# Patient Record
Sex: Male | Born: 2000 | Race: Black or African American | Hispanic: No | Marital: Single | Smoking: Never smoker
Health system: Southern US, Community
[De-identification: ages and names within clinical notes are randomized; demographics above are authoritative.]

## PROBLEM LIST (undated history)

## (undated) DIAGNOSIS — L309 Dermatitis, unspecified: Secondary | ICD-10-CM

## (undated) DIAGNOSIS — J45909 Unspecified asthma, uncomplicated: Secondary | ICD-10-CM

## (undated) DIAGNOSIS — J302 Other seasonal allergic rhinitis: Secondary | ICD-10-CM

## (undated) HISTORY — PX: CIRCUMCISION: SUR203

---
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
## 2022-01-04 LAB — CYTOLOGY, (ORAL, ANAL, URETHRAL) ANCILLARY ONLY
Chlamydia: NEGATIVE
Comment: NEGATIVE
Comment: NEGATIVE
Comment: NORMAL
Neisseria Gonorrhea: NEGATIVE
Trichomonas: NEGATIVE

## 2022-04-16 ENCOUNTER — Emergency Department (HOSPITAL_COMMUNITY): Payer: Medicaid Other

## 2022-04-16 ENCOUNTER — Emergency Department (HOSPITAL_COMMUNITY)
Admission: EM | Admit: 2022-04-16 | Discharge: 2022-04-16 | Disposition: A | Discharge status: Home / Self Care | Payer: Medicaid Other | Attending: Emergency Medicine | Admitting: Emergency Medicine

## 2022-04-16 ENCOUNTER — Encounter (HOSPITAL_COMMUNITY): Payer: Self-pay | Admitting: *Deleted

## 2022-04-16 ENCOUNTER — Other Ambulatory Visit: Payer: Self-pay

## 2022-04-16 DIAGNOSIS — S8992XA Unspecified injury of left lower leg, initial encounter: Secondary | ICD-10-CM | POA: Diagnosis present

## 2022-04-16 DIAGNOSIS — W3400XA Accidental discharge from unspecified firearms or gun, initial encounter: Secondary | ICD-10-CM | POA: Diagnosis not present

## 2022-04-16 DIAGNOSIS — S81832A Puncture wound without foreign body, left lower leg, initial encounter: Secondary | ICD-10-CM | POA: Insufficient documentation

## 2022-04-16 DIAGNOSIS — S50812A Abrasion of left forearm, initial encounter: Secondary | ICD-10-CM | POA: Diagnosis not present

## 2022-04-16 DIAGNOSIS — Y92007 Garden or yard of unspecified non-institutional (private) residence as the place of occurrence of the external cause: Secondary | ICD-10-CM | POA: Insufficient documentation

## 2022-04-16 DIAGNOSIS — S80811A Abrasion, right lower leg, initial encounter: Secondary | ICD-10-CM | POA: Insufficient documentation

## 2022-04-16 LAB — COMPREHENSIVE METABOLIC PANEL
ALT: 9 U/L (ref 0–44)
AST: 19 U/L (ref 15–41)
Albumin: 4 g/dL (ref 3.5–5.0)
Alkaline Phosphatase: 68 U/L (ref 38–126)
Anion gap: 9 (ref 5–15)
BUN: 8 mg/dL (ref 6–20)
CO2: 25 mmol/L (ref 22–32)
Calcium: 9.4 mg/dL (ref 8.9–10.3)
Chloride: 104 mmol/L (ref 98–111)
Creatinine, Ser: 1.21 mg/dL (ref 0.61–1.24)
GFR, Estimated: >60 mL/min (ref >60)
Glucose, Bld: 117 mg/dL — ABNORMAL HIGH (ref 70–99)
Potassium: 3.1 mmol/L — ABNORMAL LOW (ref 3.5–5.1)
Sodium: 138 mmol/L (ref 135–145)
Total Bilirubin: 0.7 mg/dL (ref 0.3–1.2)
Total Protein: 6.7 g/dL (ref 6.5–8.1)

## 2022-04-16 LAB — I-STAT CHEM 8, ED
BUN: 8 mg/dL (ref 6–20)
Calcium, Ion: 1.09 mmol/L — ABNORMAL LOW (ref 1.15–1.40)
Chloride: 100 mmol/L (ref 98–111)
Creatinine, Ser: 1.1 mg/dL (ref 0.61–1.24)
Glucose, Bld: 114 mg/dL — ABNORMAL HIGH (ref 70–99)
HCT: 42 % (ref 39.0–52.0)
Hemoglobin: 14.3 g/dL (ref 13.0–17.0)
Potassium: 3.1 mmol/L — ABNORMAL LOW (ref 3.5–5.1)
Sodium: 141 mmol/L (ref 135–145)
TCO2: 23 mmol/L (ref 22–32)

## 2022-04-16 LAB — CBC
HCT: 40.7 % (ref 39.0–52.0)
Hemoglobin: 13.5 g/dL (ref 13.0–17.0)
MCH: 28.1 pg (ref 26.0–34.0)
MCHC: 33.2 g/dL (ref 30.0–36.0)
MCV: 84.8 fL (ref 80.0–100.0)
Platelets: 246 10*3/uL (ref 150–400)
RBC: 4.8 MIL/uL (ref 4.22–5.81)
RDW: 13.7 % (ref 11.5–15.5)
WBC: 9.8 10*3/uL (ref 4.0–10.5)
nRBC: 0 % (ref 0.0–0.2)

## 2022-04-16 LAB — ETHANOL: Alcohol, Ethyl (B): <10 mg/dL (ref <10)

## 2022-04-16 MED ORDER — MORPHINE SULFATE (PF) 4 MG/ML IV SOLN
4.0000 mg | Freq: Once | INTRAVENOUS | Status: AC
  Administered 2022-04-16: 4 mg via INTRAVENOUS
  Filled 2022-04-16: qty 1

## 2022-04-16 MED ORDER — CEPHALEXIN 500 MG PO CAPS: 500.0000 mg | ORAL_CAPSULE | Freq: Three times a day (TID) | ORAL | 0 refills | Status: DC

## 2022-04-16 MED ORDER — OXYCODONE-ACETAMINOPHEN 5-325 MG PO TABS: 1.0000 | ORAL_TABLET | Freq: Four times a day (QID) | ORAL | 0 refills | Status: DC | PRN

## 2022-04-16 MED ORDER — IOHEXOL 350 MG/ML SOLN
100.0000 mL | Freq: Once | INTRAVENOUS | Status: AC | PRN
  Administered 2022-04-16: 100 mL via INTRAVENOUS

## 2022-04-16 MED ORDER — LACTATED RINGERS IV BOLUS
1000.0000 mL | Freq: Once | INTRAVENOUS | Status: AC
  Administered 2022-04-16: 1000 mL via INTRAVENOUS

## 2022-04-16 MED ORDER — CEFAZOLIN SODIUM-DEXTROSE 2-4 GM/100ML-% IV SOLN
2.0000 g | Freq: Once | INTRAVENOUS | Status: AC
  Administered 2022-04-16: 2 g via INTRAVENOUS
  Filled 2022-04-16: qty 100

## 2022-04-16 MED ORDER — ONDANSETRON HCL 4 MG/2ML IJ SOLN
4.0000 mg | Freq: Once | INTRAMUSCULAR | Status: AC
  Administered 2022-04-16: 4 mg via INTRAVENOUS
  Filled 2022-04-16: qty 2
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
## 2022-04-16 NOTE — ED Notes (Signed)
Lab at BS 

## 2022-04-16 NOTE — ED Notes (Signed)
TRN at [facility]. Pt going to CT. No changes, alert, NAD, calm, interactive, resps e/u, speaking in clear complete sentences.
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
## 2022-04-16 NOTE — ED Notes (Signed)
Registration finished at [facility]. [employer] into room speaking with pt.

## 2022-04-16 NOTE — ED Notes (Signed)
Xray at BS 
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
## 2023-08-06 ENCOUNTER — Other Ambulatory Visit: Payer: Self-pay

## 2023-08-06 ENCOUNTER — Encounter (HOSPITAL_COMMUNITY): Payer: Self-pay | Admitting: Emergency Medicine

## 2023-08-06 ENCOUNTER — Emergency Department (HOSPITAL_COMMUNITY): Payer: Medicaid Other

## 2023-08-06 ENCOUNTER — Emergency Department (HOSPITAL_COMMUNITY)
Admission: EM | Admit: 2023-08-06 | Discharge: 2023-08-07 | Disposition: A | Discharge status: Home / Self Care | Payer: Medicaid Other | Attending: Emergency Medicine | Admitting: Emergency Medicine

## 2023-08-06 DIAGNOSIS — Z4800 Encounter for change or removal of nonsurgical wound dressing: Secondary | ICD-10-CM | POA: Diagnosis not present

## 2023-08-06 DIAGNOSIS — W3400XA Accidental discharge from unspecified firearms or gun, initial encounter: Secondary | ICD-10-CM | POA: Diagnosis not present

## 2023-08-06 DIAGNOSIS — S61431A Puncture wound without foreign body of right hand, initial encounter: Secondary | ICD-10-CM | POA: Diagnosis present

## 2023-08-06 LAB — I-STAT CHEM 8, ED
BUN: 13 mg/dL (ref 6–20)
Calcium, Ion: 1.09 mmol/L — ABNORMAL LOW (ref 1.15–1.40)
Chloride: 102 mmol/L (ref 98–111)
Creatinine, Ser: 1.2 mg/dL (ref 0.61–1.24)
Glucose, Bld: 124 mg/dL — ABNORMAL HIGH (ref 70–99)
HCT: 40 % (ref 39.0–52.0)
Hemoglobin: 13.6 g/dL (ref 13.0–17.0)
Potassium: 3 mmol/L — ABNORMAL LOW (ref 3.5–5.1)
Sodium: 142 mmol/L (ref 135–145)
TCO2: 24 mmol/L (ref 22–32)

## 2023-08-06 LAB — I-STAT CG4 LACTIC ACID, ED: Lactic Acid, Venous: 5.3 mmol/L (ref 0.5–1.9)

## 2023-08-06 MED ORDER — HYDROMORPHONE HCL 1 MG/ML IJ SOLN
0.5000 mg | Freq: Once | INTRAMUSCULAR | Status: AC
  Administered 2023-08-06: 0.5 mg via INTRAVENOUS
  Filled 2023-08-06: qty 1

## 2023-08-06 MED ORDER — OXYCODONE-ACETAMINOPHEN 5-325 MG PO TABS: 1.0000 | ORAL_TABLET | Freq: Four times a day (QID) | ORAL | 0 refills | Status: DC | PRN

## 2023-08-06 MED ORDER — CEFAZOLIN SODIUM-DEXTROSE 2-4 GM/100ML-% IV SOLN
2.0000 g | Freq: Once | INTRAVENOUS | Status: AC
  Administered 2023-08-06: 2 g via INTRAVENOUS
  Filled 2023-08-06: qty 100

## 2023-08-06 MED ORDER — FENTANYL CITRATE PF 50 MCG/ML IJ SOSY
50.0000 ug | PREFILLED_SYRINGE | INTRAMUSCULAR | Status: AC | PRN
  Administered 2023-08-06 (×2): 50 ug via INTRAVENOUS
  Filled 2023-08-06 (×2): qty 1

## 2023-08-06 MED ORDER — LIDOCAINE-EPINEPHRINE (PF) 2 %-1:200000 IJ SOLN
10.0000 mL | Freq: Once | INTRAMUSCULAR | Status: AC
  Administered 2023-08-06: 10 mL
  Filled 2023-08-06: qty 20

## 2023-08-06 MED ORDER — FENTANYL CITRATE PF 50 MCG/ML IJ SOSY
50.0000 ug | PREFILLED_SYRINGE | Freq: Once | INTRAMUSCULAR | Status: AC
  Administered 2023-08-06: 50 ug via INTRAVENOUS
  Filled 2023-08-06: qty 1

## 2023-08-06 MED ORDER — CEPHALEXIN 500 MG PO CAPS: 500.0000 mg | ORAL_CAPSULE | Freq: Four times a day (QID) | ORAL | 0 refills | Status: AC
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
## 2023-08-06 NOTE — ED Notes (Signed)
Pt arrives to room 9 at this time. Taking over care of this pt at this time.

## 2023-08-06 NOTE — Discharge Instructions (Addendum)
Call the hand surgeon to follow-up later this week.  Take the antibiotics.  Take the pain medicines as needed.  Watch for increasing pain or signs of infection.

## 2023-08-06 NOTE — ED Notes (Signed)
Dressing placed on wound using non adherent dressing, 4x4 gauze, gauze roll and coban prior to splint placement.

## 2023-08-06 NOTE — ED Provider Notes (Signed)
..  Laceration Repair  Date/Time: 08/06/2023 11:05 PM  Performed by: Dolphus Jenny, PA-C Authorized by: Dolphus Jenny, PA-C   Consent:    Consent obtained:  Verbal   Consent given by:  Patient   Risks discussed:  Infection, pain, poor cosmetic result, tendon damage, nerve damage, need for additional repair and retained foreign body   Alternatives discussed:  No treatment Universal protocol:    Imaging studies available: yes     Patient identity confirmed:  Verbally with patient Anesthesia:    Anesthesia method:  Local infiltration   Local anesthetic:  Lidocaine 2% WITH epi Laceration details:    Location:  Hand   Hand location:  R hand, dorsum   Length (cm):  4   Depth (mm):  6 Pre-procedure details:    Preparation:  Patient was prepped and draped in usual sterile fashion and imaging obtained to evaluate for foreign bodies Exploration:    Imaging obtained: x-ray   Treatment:    Area cleansed with:  Saline   Amount of cleaning:  Extensive   Irrigation solution:  Sterile water   Irrigation method:  Pressure wash Skin repair:    Repair method:  Sutures   Suture size:  4-0   Suture material:  Prolene   Number of sutures:  3 Approximation:    Approximation:  Loose Repair type:    Repair type:  Simple Post-procedure details:    Procedure completion:  Karma Lew, PA-C 08/06/23 2306    Benjiman Core, MD 08/07/23 0001

## 2023-08-06 NOTE — ED Notes (Signed)
Wound irrigated prior to wound closure.
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
## 2023-08-06 NOTE — ED Notes (Signed)
Portable xray called to triage for imaging

## 2023-08-07 ENCOUNTER — Emergency Department (HOSPITAL_COMMUNITY)
Admission: EM | Admit: 2023-08-07 | Discharge: 2023-08-07 | Disposition: A | Discharge status: Home / Self Care | Payer: Medicaid Other | Source: Home / Self Care | Attending: Emergency Medicine | Admitting: Emergency Medicine

## 2023-08-07 ENCOUNTER — Telehealth: Payer: Self-pay | Admitting: Orthopedic Surgery

## 2023-08-07 DIAGNOSIS — S61431D Puncture wound without foreign body of right hand, subsequent encounter: Secondary | ICD-10-CM | POA: Insufficient documentation

## 2023-08-07 DIAGNOSIS — Z5189 Encounter for other specified aftercare: Secondary | ICD-10-CM

## 2023-08-07 DIAGNOSIS — W3400XD Accidental discharge from unspecified firearms or gun, subsequent encounter: Secondary | ICD-10-CM | POA: Insufficient documentation

## 2023-08-07 DIAGNOSIS — Z4800 Encounter for change or removal of nonsurgical wound dressing: Secondary | ICD-10-CM | POA: Insufficient documentation
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
## 2023-08-07 NOTE — Discharge Instructions (Addendum)
Follow-up as noted in previous visit from yesterday within the week.  Keep area dry.  Can take Tylenol or ibuprofen for pain every 4-6 hours.  Return to office if experiencing bleeding through bandage, fever, dizziness, numbness in fingers/inability to move fingers/fingers changing color.

## 2023-08-07 NOTE — Progress Notes (Signed)
Orthopedic Tech Progress Note Patient Details:  Nicholas Koch [birth year] [id number]  Ortho Devices Type of Ortho Device: Volar splint Ortho Device/Splint Location: rue volar and dorsal Ortho Device/Splint Interventions: Ordered, Application, Adjustment  I applied a long volar and dorsal from pip to lower forearm at drs request. Post Interventions Patient Tolerated: Well Instructions Provided: Care of device, Adjustment of device  Trinna Post 08/07/2023, 6:56 AM
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
## 2023-08-07 NOTE — ED Notes (Signed)
R hand wound dressing applied using Xeroform, combat gauze, and wrapped prior to ortho tech providing splint.

## 2023-08-07 NOTE — Progress Notes (Signed)
Orthopedic Tech Progress Note Patient Details:  Nicholas Koch 2001/02/15 [id number]  Ortho Devices Type of Ortho Device: Volar splint Ortho Device/Splint Location: RUE Ortho Device/Splint Interventions: Ordered, Application, Adjustment   Post Interventions Patient Tolerated: Well Instructions Provided: Care of device  [name] 08/07/2023, 10:07 PM

## 2023-08-09 ENCOUNTER — Ambulatory Visit: Payer: Medicaid Other | Admitting: Orthopedic Surgery

## 2023-08-10 ENCOUNTER — Ambulatory Visit (INDEPENDENT_AMBULATORY_CARE_PROVIDER_SITE_OTHER): Payer: Medicaid Other | Admitting: Orthopedic Surgery

## 2023-08-10 ENCOUNTER — Other Ambulatory Visit (INDEPENDENT_AMBULATORY_CARE_PROVIDER_SITE_OTHER): Payer: Self-pay

## 2023-08-10 ENCOUNTER — Other Ambulatory Visit (INDEPENDENT_AMBULATORY_CARE_PROVIDER_SITE_OTHER): Payer: Medicaid Other

## 2023-08-10 ENCOUNTER — Other Ambulatory Visit: Payer: Self-pay | Admitting: Orthopedic Surgery

## 2023-08-10 DIAGNOSIS — W3400XA Accidental discharge from unspecified firearms or gun, initial encounter: Secondary | ICD-10-CM

## 2023-08-10 DIAGNOSIS — M25531 Pain in right wrist: Secondary | ICD-10-CM

## 2023-08-10 MED ORDER — HYDROCODONE-ACETAMINOPHEN 5-325 MG PO TABS: 1.0000 | ORAL_TABLET | Freq: Four times a day (QID) | ORAL | 0 refills | Status: DC | PRN
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
## 2023-08-13 ENCOUNTER — Other Ambulatory Visit: Payer: Self-pay | Admitting: Orthopedic Surgery

## 2023-08-13 MED ORDER — HYDROCODONE-ACETAMINOPHEN 5-325 MG PO TABS: 1.0000 | ORAL_TABLET | Freq: Four times a day (QID) | ORAL | 0 refills | Status: AC | PRN

## 2023-08-14 ENCOUNTER — Other Ambulatory Visit: Payer: Self-pay | Admitting: Orthopedic Surgery

## 2023-08-14 DIAGNOSIS — M25531 Pain in right wrist: Secondary | ICD-10-CM

## 2023-08-14 DIAGNOSIS — S61441A Puncture wound with foreign body of right hand, initial encounter: Secondary | ICD-10-CM | POA: Diagnosis not present

## 2023-08-14 DIAGNOSIS — W3400XA Accidental discharge from unspecified firearms or gun, initial encounter: Secondary | ICD-10-CM

## 2023-08-14 DIAGNOSIS — S66201A Unspecified injury of extensor muscle, fascia and tendon of right thumb at wrist and hand level, initial encounter: Secondary | ICD-10-CM | POA: Diagnosis not present

## 2023-08-16 ENCOUNTER — Telehealth: Payer: Self-pay | Admitting: Orthopedic Surgery

## 2023-08-16 NOTE — Telephone Encounter (Signed)
Patient called at 11:57 am and left voicemail wanting to know if he can drive?    Please call him back at [phone]

## 2023-08-16 NOTE — Telephone Encounter (Signed)
Spoke with patient. He is not on any narcotics, so I informed him that he may drive then.

## 2023-08-17 ENCOUNTER — Telehealth: Payer: Self-pay | Admitting: Orthopedic Surgery

## 2023-08-17 NOTE — Telephone Encounter (Signed)
[place] forms received. Please advise how long patient will be oow. Thank you!

## 2023-08-20 NOTE — Telephone Encounter (Signed)
Form completed and faxed. 

## 2023-08-22 ENCOUNTER — Ambulatory Visit (INDEPENDENT_AMBULATORY_CARE_PROVIDER_SITE_OTHER): Payer: Medicaid Other | Admitting: Orthopedic Surgery

## 2023-08-22 ENCOUNTER — Telehealth: Payer: Self-pay | Admitting: Orthopedic Surgery

## 2023-08-22 DIAGNOSIS — S61441D Puncture wound with foreign body of right hand, subsequent encounter: Secondary | ICD-10-CM

## 2023-08-22 DIAGNOSIS — W3400XA Accidental discharge from unspecified firearms or gun, initial encounter: Secondary | ICD-10-CM
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
## 2023-08-27 NOTE — Telephone Encounter (Signed)
Spoke with mom Patient is scheduled for Friday 6th

## 2023-08-30 ENCOUNTER — Encounter: Payer: Medicaid Other | Admitting: Occupational Therapy

## 2023-08-31 ENCOUNTER — Ambulatory Visit (INDEPENDENT_AMBULATORY_CARE_PROVIDER_SITE_OTHER): Payer: Medicaid Other | Admitting: Orthopedic Surgery

## 2023-08-31 DIAGNOSIS — S61441A Puncture wound with foreign body of right hand, initial encounter: Secondary | ICD-10-CM

## 2023-08-31 DIAGNOSIS — W3400XA Accidental discharge from unspecified firearms or gun, initial encounter: Secondary | ICD-10-CM
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
## 2023-09-11 ENCOUNTER — Ambulatory Visit: Payer: Medicaid Other | Admitting: Occupational Therapy

## 2023-09-12 ENCOUNTER — Ambulatory Visit: Payer: Medicaid Other | Attending: Orthopedic Surgery | Admitting: Occupational Therapy

## 2023-09-12 ENCOUNTER — Encounter: Payer: Self-pay | Admitting: Occupational Therapy

## 2023-09-12 ENCOUNTER — Ambulatory Visit (INDEPENDENT_AMBULATORY_CARE_PROVIDER_SITE_OTHER): Payer: Medicaid Other | Admitting: Orthopedic Surgery

## 2023-09-12 DIAGNOSIS — M6281 Muscle weakness (generalized): Secondary | ICD-10-CM | POA: Insufficient documentation

## 2023-09-12 DIAGNOSIS — W3400XA Accidental discharge from unspecified firearms or gun, initial encounter: Secondary | ICD-10-CM

## 2023-09-12 DIAGNOSIS — R278 Other lack of coordination: Secondary | ICD-10-CM | POA: Diagnosis present

## 2023-09-12 DIAGNOSIS — Z9889 Other specified postprocedural states: Secondary | ICD-10-CM | POA: Insufficient documentation

## 2023-09-12 DIAGNOSIS — M25531 Pain in right wrist: Secondary | ICD-10-CM | POA: Diagnosis not present

## 2023-09-12 DIAGNOSIS — R29898 Other symptoms and signs involving the musculoskeletal system: Secondary | ICD-10-CM | POA: Insufficient documentation

## 2023-09-12 DIAGNOSIS — S61441A Puncture wound with foreign body of right hand, initial encounter: Secondary | ICD-10-CM
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
## 2023-09-14 ENCOUNTER — Telehealth: Payer: Self-pay | Admitting: Orthopedic Surgery

## 2023-09-14 NOTE — Telephone Encounter (Signed)
[place] forms received. To [employer]. 

## 2023-09-17 ENCOUNTER — Ambulatory Visit: Payer: Medicaid Other | Admitting: Occupational Therapy

## 2023-09-20 ENCOUNTER — Telehealth: Payer: Self-pay | Admitting: Occupational Therapy

## 2023-09-20 ENCOUNTER — Ambulatory Visit: Payer: Medicaid Other | Admitting: Occupational Therapy

## 2023-09-20 NOTE — Telephone Encounter (Signed)
Pt no-showed for today's OT appointment, which is second no-show. OT called pt's listed mobile phone number [phone]).   OT educated pt on clinic's no-show/late policy. Pt verbalized understanding. OT provided pt with date/time of next OT appointment. Pt verbalized understanding. Pt reported some confusion d/t thinking next OT appointment was 10/08/23. OT verified that 10/08/23 was the date of pt's f/u MD appointment. OT educated pt on OT POC 2x per week and pt verbalized understanding of all.

## 2023-09-25 ENCOUNTER — Ambulatory Visit: Payer: Medicaid Other | Admitting: Occupational Therapy

## 2023-09-25 DIAGNOSIS — R278 Other lack of coordination: Secondary | ICD-10-CM | POA: Diagnosis not present

## 2023-09-25 DIAGNOSIS — R29898 Other symptoms and signs involving the musculoskeletal system: Secondary | ICD-10-CM

## 2023-09-25 DIAGNOSIS — M6281 Muscle weakness (generalized): Secondary | ICD-10-CM
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
## 2023-09-27 ENCOUNTER — Ambulatory Visit: Payer: Medicaid Other | Attending: Orthopedic Surgery | Admitting: Occupational Therapy

## 2023-09-27 DIAGNOSIS — M6281 Muscle weakness (generalized): Secondary | ICD-10-CM | POA: Insufficient documentation

## 2023-09-27 DIAGNOSIS — R29898 Other symptoms and signs involving the musculoskeletal system: Secondary | ICD-10-CM | POA: Diagnosis present

## 2023-09-27 DIAGNOSIS — R278 Other lack of coordination: Secondary | ICD-10-CM | POA: Insufficient documentation

## 2023-09-27 NOTE — Patient Instructions (Signed)
Swelling Reduction  Place affected area above the level of your heart. You can use pillows for support.  Move the affected area. Think about tightening the muscles in this area and then relaxing them.  "Pet" the area from the outside of your body toward the center of your body. If it is your hand, from the tips of your fingers to the shoulder or chest.    
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
## 2023-10-02 ENCOUNTER — Ambulatory Visit: Payer: Medicaid Other | Admitting: Occupational Therapy
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
## 2023-10-04 ENCOUNTER — Ambulatory Visit: Payer: Medicaid Other | Admitting: Occupational Therapy

## 2023-10-05 ENCOUNTER — Ambulatory Visit: Payer: Medicaid Other | Admitting: Occupational Therapy

## 2023-10-05 DIAGNOSIS — R278 Other lack of coordination: Secondary | ICD-10-CM

## 2023-10-05 DIAGNOSIS — R29898 Other symptoms and signs involving the musculoskeletal system: Secondary | ICD-10-CM

## 2023-10-05 DIAGNOSIS — M6281 Muscle weakness (generalized): Secondary | ICD-10-CM

## 2023-10-05 NOTE — Patient Instructions (Addendum)
 [email or web address] Access Code: [email or web address]
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
## 2023-10-08 ENCOUNTER — Encounter: Payer: Medicaid Other | Admitting: Orthopedic Surgery

## 2023-10-09 ENCOUNTER — Ambulatory Visit: Payer: Medicaid Other | Admitting: Occupational Therapy

## 2023-10-09 DIAGNOSIS — M6281 Muscle weakness (generalized): Secondary | ICD-10-CM

## 2023-10-09 DIAGNOSIS — R278 Other lack of coordination: Secondary | ICD-10-CM | POA: Diagnosis not present

## 2023-10-09 DIAGNOSIS — R29898 Other symptoms and signs involving the musculoskeletal system: Secondary | ICD-10-CM
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
## 2023-10-09 NOTE — Patient Instructions (Addendum)
 Scar massage: Circles, perpendicular, parallel, "moon walk"   At least 5-7x per day for 3- to 5-minute sessions Frequency and consistency are more important than a single long session.  Exercises Active-assisted "place and hold" (same as on 10/05/23 handout) - use L hand to bend fingers, try to hold position actively with R hand. Finger opposition / Finger taps - Active right hand Step (1) "Bear claw" (bend fingers down). Step (2) Bring thumb towards fingers (alternate each finger) Step (3) - Try to incorporate bending "stiff knuckles / "big knuckles" (MCP joints)  Splint Wear splint at night and when driving Remove 3x per day for 1 to 1.5 hour sessions and when completing exercises

## 2023-10-10 ENCOUNTER — Ambulatory Visit (INDEPENDENT_AMBULATORY_CARE_PROVIDER_SITE_OTHER): Payer: Medicaid Other | Admitting: Orthopedic Surgery

## 2023-10-10 DIAGNOSIS — W3400XA Accidental discharge from unspecified firearms or gun, initial encounter: Secondary | ICD-10-CM

## 2023-10-10 DIAGNOSIS — S61441A Puncture wound with foreign body of right hand, initial encounter: Secondary | ICD-10-CM

## 2023-10-10 NOTE — Progress Notes (Signed)
   Nicholas Koch - 23 y.o. male MRN [id number]  Date of birth: 07-08-2001  Office Visit Note: Visit Date: 10/10/2023 PCP: Brendan Call, MD Referred by: Brendan Call, MD  Subjective:  HPI: Nicholas Koch is a 23 y.o. male who presents today for follow up 8 weeks status post right forearm foreign body excision, right hand extensor tendon repair.  He is doing well postoperatively, he is doing occupational therapy twice a week and is improving from a range of motion standpoint.  Pertinent ROS were reviewed with the patient and found to be negative unless otherwise specified above in HPI.   Assessment & Plan: Visit Diagnoses: No diagnosis found.  Plan: He continues to do well postoperatively.  I have encouraged that he continue with occupational therapy as instructed with emphasis on range of motion and progression to strengthening as tolerated both the hand and wrist.  He is interested in potentially returning to work.  Will keep him on light duty for the time being, 5 pound weight restriction of the right hand, utilized the brace as needed.  I will plan on seeing him back in approximate weeks to track his progress.  Follow-up: No follow-ups on file.   Meds & Orders: No orders of the defined types were placed in this encounter.  No orders of the defined types were placed in this encounter.    Procedures: No procedures performed       Objective:   Vital Signs: There were no vitals taken for this visit.  Ortho Exam Right hand and forearm - Well-healed incisional sites over the dorsal aspect of the hand and forearm, skin edges well-approximated without erythema or drainage - Able to perform gentle range of motion of the digits, has intact extension of the wrist and digits without significant lag, limited secondary to pain - Sensation is intact distally, hand is warm well-perfused     Imaging: No results found.   Brinleigh Tew Alvia Jointer, M.D. [facility] 9:19 AM

## 2023-10-11 ENCOUNTER — Ambulatory Visit: Payer: Medicaid Other | Admitting: Occupational Therapy

## 2023-10-16 ENCOUNTER — Ambulatory Visit: Payer: Medicaid Other | Admitting: Occupational Therapy

## 2023-10-16 ENCOUNTER — Encounter: Payer: Medicaid Other | Admitting: Occupational Therapy

## 2023-10-16 DIAGNOSIS — R29898 Other symptoms and signs involving the musculoskeletal system: Secondary | ICD-10-CM

## 2023-10-16 DIAGNOSIS — R278 Other lack of coordination: Secondary | ICD-10-CM

## 2023-10-16 DIAGNOSIS — M6281 Muscle weakness (generalized): Secondary | ICD-10-CM
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
## 2023-10-16 NOTE — Patient Instructions (Addendum)
HEP: Pick up coins from tabletop and place in a container with slot. Alternate fingers as able.  Scar tissue with Dycem (yellow material) Circle, parallel, zig-zags, "moon walks" Complete gentle tip pinch with opposite hand (index finger and thumb) to "lift" scar gently. Avoid strong pinch. Continue scar massage both with AND without Dycem

## 2023-10-18 ENCOUNTER — Ambulatory Visit: Payer: Self-pay | Admitting: Occupational Therapy

## 2023-10-18 ENCOUNTER — Encounter: Payer: Medicaid Other | Admitting: Occupational Therapy

## 2023-10-18 DIAGNOSIS — R278 Other lack of coordination: Secondary | ICD-10-CM | POA: Diagnosis not present

## 2023-10-18 DIAGNOSIS — M6281 Muscle weakness (generalized): Secondary | ICD-10-CM

## 2023-10-18 DIAGNOSIS — R29898 Other symptoms and signs involving the musculoskeletal system: Secondary | ICD-10-CM
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
## 2023-10-23 ENCOUNTER — Ambulatory Visit: Payer: Medicaid Other | Admitting: Occupational Therapy

## 2023-10-23 ENCOUNTER — Encounter: Payer: Medicaid Other | Admitting: Occupational Therapy

## 2023-10-23 DIAGNOSIS — R278 Other lack of coordination: Secondary | ICD-10-CM

## 2023-10-23 DIAGNOSIS — M6281 Muscle weakness (generalized): Secondary | ICD-10-CM

## 2023-10-23 DIAGNOSIS — R29898 Other symptoms and signs involving the musculoskeletal system: Secondary | ICD-10-CM
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
## 2023-10-23 NOTE — Patient Instructions (Signed)
  [email or web address]  Access Code: [email or web address]

## 2023-10-25 ENCOUNTER — Encounter: Payer: Medicaid Other | Admitting: Occupational Therapy

## 2023-10-25 ENCOUNTER — Ambulatory Visit: Payer: Medicaid Other | Admitting: Occupational Therapy

## 2023-10-30 ENCOUNTER — Ambulatory Visit: Payer: Medicaid Other | Attending: Orthopedic Surgery | Admitting: Occupational Therapy

## 2023-10-30 DIAGNOSIS — R278 Other lack of coordination: Secondary | ICD-10-CM

## 2023-10-30 DIAGNOSIS — M6281 Muscle weakness (generalized): Secondary | ICD-10-CM | POA: Diagnosis present

## 2023-10-30 DIAGNOSIS — R29898 Other symptoms and signs involving the musculoskeletal system: Secondary | ICD-10-CM
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
## 2023-11-01 ENCOUNTER — Ambulatory Visit: Payer: Medicaid Other | Admitting: Occupational Therapy

## 2023-11-01 DIAGNOSIS — R278 Other lack of coordination: Secondary | ICD-10-CM | POA: Diagnosis not present

## 2023-11-01 DIAGNOSIS — M6281 Muscle weakness (generalized): Secondary | ICD-10-CM

## 2023-11-01 DIAGNOSIS — R29898 Other symptoms and signs involving the musculoskeletal system: Secondary | ICD-10-CM
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
## 2023-11-05 ENCOUNTER — Ambulatory Visit: Payer: Medicaid Other | Admitting: Orthopedic Surgery

## 2023-11-05 ENCOUNTER — Ambulatory Visit: Payer: Medicaid Other | Admitting: Occupational Therapy

## 2023-11-08 ENCOUNTER — Ambulatory Visit: Payer: Medicaid Other | Admitting: Occupational Therapy

## 2023-11-08 DIAGNOSIS — R278 Other lack of coordination: Secondary | ICD-10-CM | POA: Diagnosis not present

## 2023-11-08 DIAGNOSIS — M6281 Muscle weakness (generalized): Secondary | ICD-10-CM

## 2023-11-08 DIAGNOSIS — R29898 Other symptoms and signs involving the musculoskeletal system: Secondary | ICD-10-CM
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
## 2023-11-13 ENCOUNTER — Ambulatory Visit: Payer: Medicaid Other | Admitting: Occupational Therapy

## 2023-11-13 ENCOUNTER — Ambulatory Visit: Payer: Medicaid Other | Admitting: Orthopedic Surgery

## 2023-11-13 DIAGNOSIS — R278 Other lack of coordination: Secondary | ICD-10-CM | POA: Diagnosis not present

## 2023-11-13 DIAGNOSIS — R29898 Other symptoms and signs involving the musculoskeletal system: Secondary | ICD-10-CM

## 2023-11-13 DIAGNOSIS — M6281 Muscle weakness (generalized): Secondary | ICD-10-CM
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
## 2023-11-14 ENCOUNTER — Ambulatory Visit: Payer: Medicaid Other | Admitting: Orthopedic Surgery

## 2023-11-15 ENCOUNTER — Ambulatory Visit: Payer: Medicaid Other | Admitting: Occupational Therapy

## 2023-11-15 DIAGNOSIS — R278 Other lack of coordination: Secondary | ICD-10-CM

## 2023-11-15 DIAGNOSIS — R29898 Other symptoms and signs involving the musculoskeletal system: Secondary | ICD-10-CM

## 2023-11-15 DIAGNOSIS — M6281 Muscle weakness (generalized): Secondary | ICD-10-CM
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
## 2023-11-20 ENCOUNTER — Ambulatory Visit: Payer: Medicaid Other | Admitting: Occupational Therapy

## 2023-11-20 DIAGNOSIS — R278 Other lack of coordination: Secondary | ICD-10-CM

## 2023-11-20 DIAGNOSIS — R29898 Other symptoms and signs involving the musculoskeletal system: Secondary | ICD-10-CM

## 2023-11-20 DIAGNOSIS — M6281 Muscle weakness (generalized): Secondary | ICD-10-CM
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
## 2023-11-22 ENCOUNTER — Ambulatory Visit: Payer: Medicaid Other | Admitting: Occupational Therapy

## 2023-11-22 DIAGNOSIS — R29898 Other symptoms and signs involving the musculoskeletal system: Secondary | ICD-10-CM

## 2023-11-22 DIAGNOSIS — R278 Other lack of coordination: Secondary | ICD-10-CM

## 2023-11-22 DIAGNOSIS — M6281 Muscle weakness (generalized): Secondary | ICD-10-CM
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
## 2023-11-22 NOTE — Addendum Note (Signed)
 Addended by: Delana Meyer on: 11/22/2023 05:26 PM   Modules accepted: Orders

## 2023-11-27 ENCOUNTER — Ambulatory Visit: Payer: Self-pay | Attending: Orthopedic Surgery | Admitting: Occupational Therapy

## 2023-11-27 DIAGNOSIS — R29898 Other symptoms and signs involving the musculoskeletal system: Secondary | ICD-10-CM | POA: Insufficient documentation

## 2023-11-27 DIAGNOSIS — R278 Other lack of coordination: Secondary | ICD-10-CM | POA: Diagnosis present

## 2023-11-27 DIAGNOSIS — M6281 Muscle weakness (generalized): Secondary | ICD-10-CM | POA: Insufficient documentation
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
## 2023-11-29 ENCOUNTER — Telehealth: Payer: Self-pay | Admitting: Occupational Therapy

## 2023-11-29 ENCOUNTER — Ambulatory Visit: Payer: Self-pay | Admitting: Occupational Therapy

## 2023-11-29 NOTE — Telephone Encounter (Signed)
 Pt no-showed for OT appointment today. Therefore, OT called pt's listed phone number [phone]). OT left voicemail reminding pt of next OT appointment date/time, provided education on cancellation/no-show policy, recommended to pt to call clinic office if pt is unable to make appointments, and provided contact information of clinic.

## 2023-12-04 ENCOUNTER — Ambulatory Visit: Payer: Medicaid Other | Admitting: Occupational Therapy

## 2023-12-04 DIAGNOSIS — R278 Other lack of coordination: Secondary | ICD-10-CM

## 2023-12-04 DIAGNOSIS — M6281 Muscle weakness (generalized): Secondary | ICD-10-CM

## 2023-12-04 DIAGNOSIS — R29898 Other symptoms and signs involving the musculoskeletal system: Secondary | ICD-10-CM
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
## 2023-12-06 ENCOUNTER — Ambulatory Visit: Payer: Medicaid Other | Admitting: Occupational Therapy

## 2023-12-06 DIAGNOSIS — R29898 Other symptoms and signs involving the musculoskeletal system: Secondary | ICD-10-CM

## 2023-12-06 DIAGNOSIS — R278 Other lack of coordination: Secondary | ICD-10-CM

## 2023-12-06 DIAGNOSIS — M6281 Muscle weakness (generalized): Secondary | ICD-10-CM
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
## 2023-12-10 ENCOUNTER — Ambulatory Visit: Admitting: Occupational Therapy

## 2023-12-11 ENCOUNTER — Ambulatory Visit: Admitting: Occupational Therapy

## 2023-12-11 ENCOUNTER — Telehealth: Payer: Self-pay | Admitting: Occupational Therapy

## 2023-12-11 NOTE — Telephone Encounter (Signed)
 Pt no-showed for OT appointment today. Therefore, OT called pt's listed phone number [phone]). OT left voicemail reminding pt of next OT appointment date/time and upcoming MD office visit appointment time,  recommended to pt to call clinic office if pt is unable to make appointments, and provided contact information of clinic.

## 2023-12-12 ENCOUNTER — Telehealth: Payer: Self-pay | Admitting: Orthopedic Surgery

## 2023-12-12 ENCOUNTER — Ambulatory Visit: Payer: Medicaid Other | Admitting: Orthopedic Surgery

## 2023-12-12 NOTE — Telephone Encounter (Signed)
 Patient called in past his appointment time stating he was going to be late. At this point, he has no-showed or cancelled appointments same day the past 4-5 visits. Also non compliant with OT. No more appointments will be made at this time.

## 2023-12-14 ENCOUNTER — Ambulatory Visit: Admitting: Occupational Therapy

## 2023-12-14 DIAGNOSIS — M6281 Muscle weakness (generalized): Secondary | ICD-10-CM

## 2023-12-14 DIAGNOSIS — R278 Other lack of coordination: Secondary | ICD-10-CM

## 2023-12-14 DIAGNOSIS — R29898 Other symptoms and signs involving the musculoskeletal system: Secondary | ICD-10-CM
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
## 2023-12-18 ENCOUNTER — Ambulatory Visit: Payer: Self-pay | Admitting: Occupational Therapy

## 2023-12-18 DIAGNOSIS — R278 Other lack of coordination: Secondary | ICD-10-CM

## 2023-12-18 DIAGNOSIS — R29898 Other symptoms and signs involving the musculoskeletal system: Secondary | ICD-10-CM

## 2023-12-18 DIAGNOSIS — M6281 Muscle weakness (generalized): Secondary | ICD-10-CM

## 2023-12-18 NOTE — Patient Instructions (Addendum)
 Foam grip: Composite digit flex with foam tubing   2 sets of 10 reps   Wall "slides" - straighten elbow to help wrist extend (bend backwards) - 3 sets, hold 30 seconds  Practice handwriting
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
## 2023-12-20 ENCOUNTER — Ambulatory Visit: Payer: Self-pay | Admitting: Occupational Therapy

## 2023-12-20 DIAGNOSIS — M6281 Muscle weakness (generalized): Secondary | ICD-10-CM

## 2023-12-20 DIAGNOSIS — R278 Other lack of coordination: Secondary | ICD-10-CM

## 2023-12-20 DIAGNOSIS — R29898 Other symptoms and signs involving the musculoskeletal system: Secondary | ICD-10-CM
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
## 2023-12-25 ENCOUNTER — Encounter: Payer: Self-pay | Admitting: Occupational Therapy

## 2023-12-25 ENCOUNTER — Ambulatory Visit: Payer: Self-pay | Attending: Orthopedic Surgery | Admitting: Occupational Therapy

## 2023-12-25 ENCOUNTER — Telehealth: Payer: Self-pay | Admitting: Occupational Therapy

## 2023-12-25 DIAGNOSIS — M6281 Muscle weakness (generalized): Secondary | ICD-10-CM

## 2023-12-25 DIAGNOSIS — R278 Other lack of coordination: Secondary | ICD-10-CM

## 2023-12-25 DIAGNOSIS — R29898 Other symptoms and signs involving the musculoskeletal system: Secondary | ICD-10-CM

## 2023-12-25 NOTE — Therapy (Signed)
 Phone Call Pt did not arrive for alternate OT session time today. Therefore, OT to proceed with D/C. OT called pt's listed mobile number again [phone]) and left voicemail. OT notified pt of OT D/C, educated on clinic late/no show policy, provided update regarding cancellation of 1 remaining appointment, and recommended to pt to continue to complete HEP. OT provided contact information of clinic.  OCCUPATIONAL THERAPY DISCHARGE SUMMARY  Visits from Start of Care: 21  Current functional level related to goals / functional outcomes: Pt has met 2 out of 3 STG and 2 out of 4 LTG to satisfactory levels.  Remaining deficits: Per previous OT treatment notes, pt demo'ing greatly improved functional use of affected UE for daily ADL/IADL tasks.  Pt continued to demo limited wrist and digit AROM and decreased affected UE strength though able to participate in functional tasks and work-simulation tasks.  Education / Equipment: Pt has all needed materials and education. Pt understands how to continue on with self-management. See tx notes for more details. Per phone call today (see above), OT recommended to pt to continue with HEP.    Patient goals were partially met. Patient is being discharged due to  clinic late/no show policy and pt demo'd improved functional use of affected UE.

## 2023-12-25 NOTE — Telephone Encounter (Signed)
 Pt no-showed for OT appointment today. This is pt's fourth no-show. Therefore, OT called pt's listed phone number [phone]). OT left voicemail providing pt with alternate appointment time today. However, if pt does not come for alternate appointment time, OT to D/C per clinic's cancellation/no-show policy and d/t pt's POC ending 12/28/23. OT provided contact information of clinic.

## 2023-12-27 ENCOUNTER — Ambulatory Visit: Payer: Self-pay | Admitting: Occupational Therapy

## 2024-07-28 ENCOUNTER — Encounter: Payer: Self-pay | Admitting: Radiology
